# Patient Record
Sex: Female | Born: 1960 | Smoking: Current every day smoker
Health system: Southern US, Community
[De-identification: ages and names within clinical notes are randomized; demographics above are authoritative.]

## PROBLEM LIST (undated history)

## (undated) DIAGNOSIS — E7800 Pure hypercholesterolemia, unspecified: Secondary | ICD-10-CM

## (undated) DIAGNOSIS — I10 Essential (primary) hypertension: Secondary | ICD-10-CM

## (undated) HISTORY — PX: JOINT REPLACEMENT: SHX530

## (undated) HISTORY — DX: Essential (primary) hypertension: I10

## (undated) HISTORY — DX: Pure hypercholesterolemia, unspecified: E78.00

---

## 2020-07-08 ENCOUNTER — Emergency Department
Admission: EM | Admit: 2020-07-08 | Discharge: 2020-07-08 | Disposition: A | Discharge status: Home / Self Care | Payer: Self-pay | Attending: Emergency Medicine | Admitting: Emergency Medicine

## 2020-07-08 ENCOUNTER — Other Ambulatory Visit: Payer: Self-pay

## 2020-07-08 ENCOUNTER — Emergency Department: Payer: Self-pay

## 2020-07-08 DIAGNOSIS — R110 Nausea: Secondary | ICD-10-CM | POA: Insufficient documentation

## 2020-07-08 DIAGNOSIS — Z5321 Procedure and treatment not carried out due to patient leaving prior to being seen by health care provider: Secondary | ICD-10-CM | POA: Insufficient documentation

## 2020-07-08 DIAGNOSIS — R0789 Other chest pain: Secondary | ICD-10-CM | POA: Insufficient documentation

## 2020-07-08 LAB — CBC
HCT: 37.3 % (ref 36.0–46.0)
Hemoglobin: 12.7 g/dL (ref 12.0–15.0)
MCH: 32.8 pg (ref 26.0–34.0)
MCHC: 34 g/dL (ref 30.0–36.0)
MCV: 96.4 fL (ref 80.0–100.0)
Platelets: 241 10*3/uL (ref 150–400)
RBC: 3.87 MIL/uL (ref 3.87–5.11)
RDW: 11.9 % (ref 11.5–15.5)
WBC: 7.6 10*3/uL (ref 4.0–10.5)
nRBC: 0 % (ref 0.0–0.2)

## 2020-07-08 LAB — BASIC METABOLIC PANEL
Anion gap: 9 (ref 5–15)
BUN: 19 mg/dL (ref 8–23)
CO2: 30 mmol/L (ref 22–32)
Calcium: 9.2 mg/dL (ref 8.9–10.3)
Chloride: 99 mmol/L (ref 98–111)
Creatinine, Ser: 0.81 mg/dL (ref 0.44–1.00)
GFR calc Af Amer: >60 mL/min (ref >60)
GFR calc non Af Amer: >60 mL/min (ref >60)
Glucose, Bld: 101 mg/dL — ABNORMAL HIGH (ref 70–99)
Potassium: 3.8 mmol/L (ref 3.5–5.1)
Sodium: 138 mmol/L (ref 135–145)

## 2020-07-08 LAB — TROPONIN I (HIGH SENSITIVITY): Troponin I (High Sensitivity): 5 ng/L (ref <18)

## 2020-07-08 NOTE — ED Triage Notes (Signed)
PT to ED c/o cp starting at 6pm that is center chest and going up into her neck. Says it let up and then came back. Never felt this before. States it is a lot of pressure. No dizziness or nausea, little bit of SOB. BP was elevated today

## 2022-04-22 IMAGING — CR DG CHEST 2V
2 series · 2 of 2 positions shown · non-contrast
Comparison: None.

CLINICAL DATA: PT to ED c/o cp starting at 6pm that is center chest
and going up into her neck. Says it let up and then came back. Never
felt this before. States it is a lot of pressure. No dizziness or
nausea, little bit of SOB. BP was elevated today

EXAM:
CHEST - 2 VIEW

[chest pa]
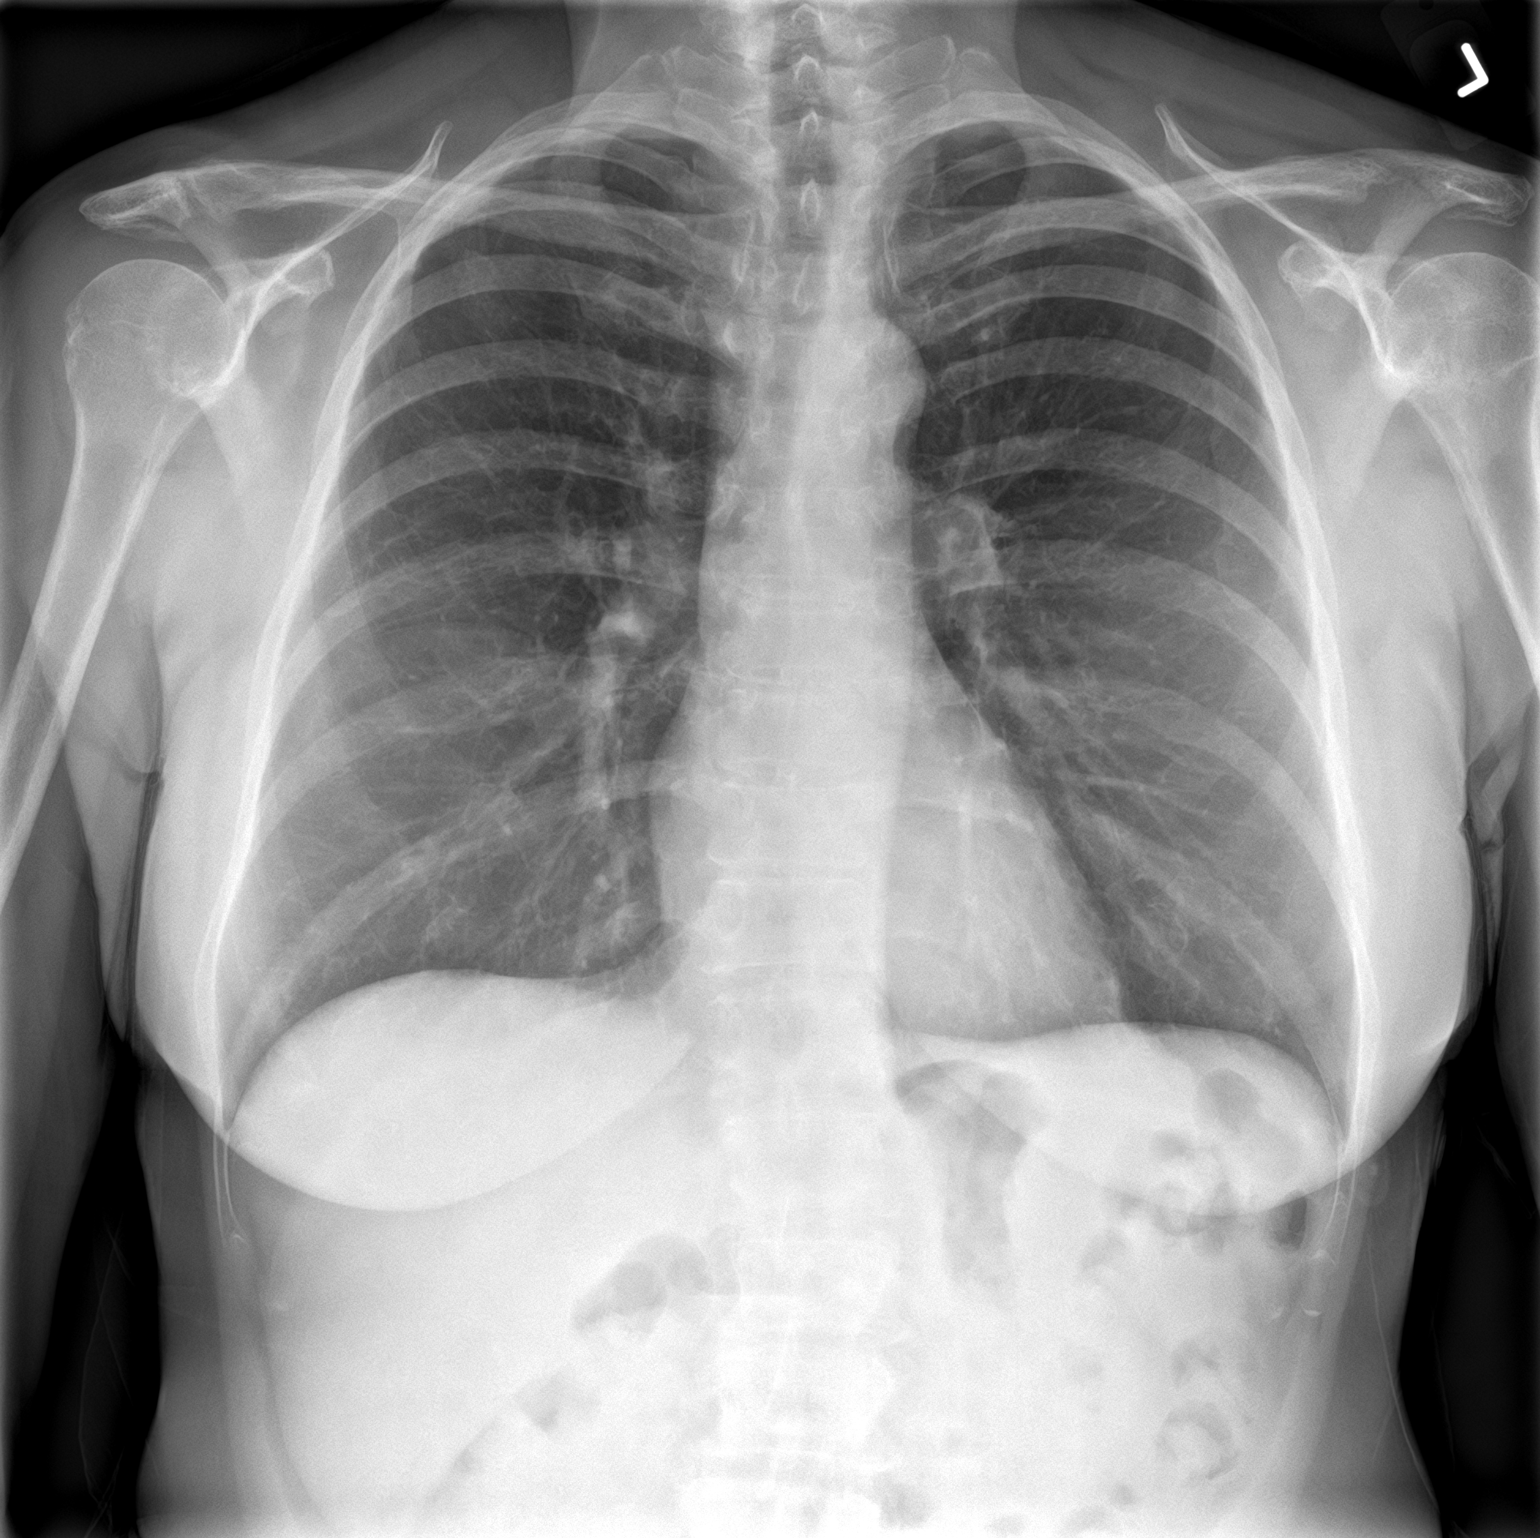

[chest lat]
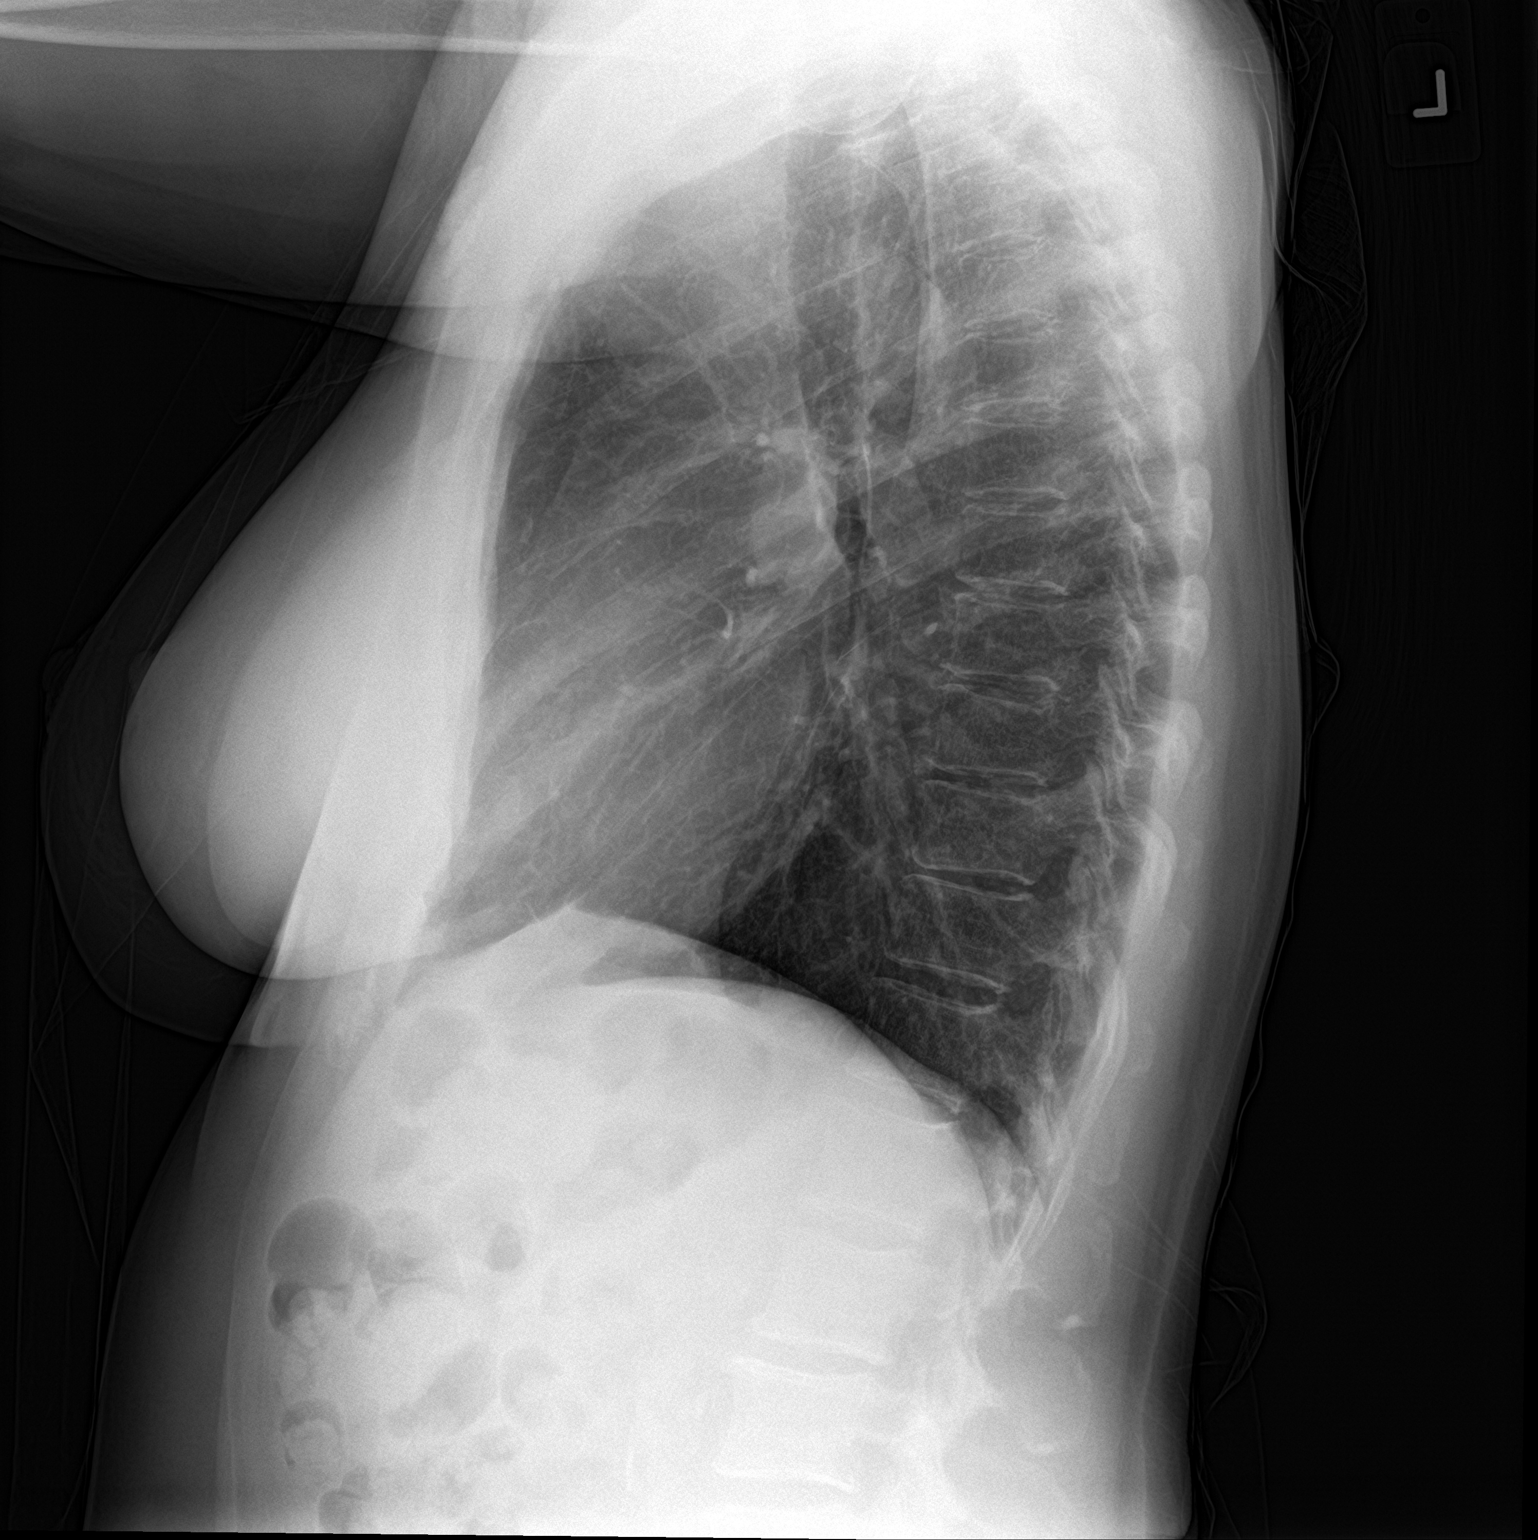

[2 of 2 positions shown; findings below may reference images not displayed]

FINDINGS: The heart size and mediastinal contours are within normal limits.
Both lungs are clear. No pleural effusion or pneumothorax. The
visualized skeletal structures are unremarkable.
IMPRESSION: No active cardiopulmonary disease.
